# Patient Record
Sex: Female | Born: 1950 | Race: White | Hispanic: No | Marital: Married | Smoking: Never smoker
Health system: Southern US, Community
[De-identification: ages and names within clinical notes are randomized; demographics above are authoritative.]

## PROBLEM LIST (undated history)

## (undated) DIAGNOSIS — M109 Gout, unspecified: Secondary | ICD-10-CM

## (undated) DIAGNOSIS — K219 Gastro-esophageal reflux disease without esophagitis: Secondary | ICD-10-CM

## (undated) DIAGNOSIS — F32A Depression, unspecified: Secondary | ICD-10-CM

## (undated) DIAGNOSIS — J45909 Unspecified asthma, uncomplicated: Secondary | ICD-10-CM

## (undated) DIAGNOSIS — E119 Type 2 diabetes mellitus without complications: Secondary | ICD-10-CM

## (undated) DIAGNOSIS — I10 Essential (primary) hypertension: Secondary | ICD-10-CM

## (undated) HISTORY — PX: JOINT REPLACEMENT: SHX530

---

## 2006-10-28 DIAGNOSIS — R1031 Right lower quadrant pain: Secondary | ICD-10-CM | POA: Insufficient documentation

## 2006-11-10 DIAGNOSIS — E785 Hyperlipidemia, unspecified: Secondary | ICD-10-CM | POA: Insufficient documentation

## 2006-11-10 DIAGNOSIS — F32A Depression, unspecified: Secondary | ICD-10-CM | POA: Insufficient documentation

## 2006-11-10 DIAGNOSIS — I10 Essential (primary) hypertension: Secondary | ICD-10-CM | POA: Insufficient documentation

## 2006-11-10 DIAGNOSIS — G8929 Other chronic pain: Secondary | ICD-10-CM | POA: Insufficient documentation

## 2007-01-11 DIAGNOSIS — Z0000 Encounter for general adult medical examination without abnormal findings: Secondary | ICD-10-CM | POA: Insufficient documentation

## 2007-09-14 DIAGNOSIS — M7060 Trochanteric bursitis, unspecified hip: Secondary | ICD-10-CM | POA: Insufficient documentation

## 2007-09-14 DIAGNOSIS — N390 Urinary tract infection, site not specified: Secondary | ICD-10-CM | POA: Insufficient documentation

## 2007-09-14 DIAGNOSIS — M7550 Bursitis of unspecified shoulder: Secondary | ICD-10-CM | POA: Insufficient documentation

## 2007-11-24 DIAGNOSIS — I890 Lymphedema, not elsewhere classified: Secondary | ICD-10-CM | POA: Insufficient documentation

## 2007-12-08 DIAGNOSIS — E559 Vitamin D deficiency, unspecified: Secondary | ICD-10-CM | POA: Insufficient documentation

## 2007-12-08 DIAGNOSIS — E876 Hypokalemia: Secondary | ICD-10-CM | POA: Insufficient documentation

## 2009-08-30 ENCOUNTER — Ambulatory Visit: Payer: Self-pay | Admitting: Internal Medicine

## 2011-01-24 DIAGNOSIS — Z860100 Personal history of colon polyps, unspecified: Secondary | ICD-10-CM | POA: Insufficient documentation

## 2012-05-17 ENCOUNTER — Emergency Department: Payer: Self-pay | Admitting: Emergency Medicine

## 2012-05-18 LAB — URINALYSIS, COMPLETE
Bilirubin,UR: NEGATIVE
Glucose,UR: NEGATIVE mg/dL (ref 0–75)
Nitrite: NEGATIVE
Ph: 6 (ref 4.5–8.0)
Specific Gravity: 1.009 (ref 1.003–1.030)
Squamous Epithelial: 4
WBC UR: 1 /HPF (ref 0–5)

## 2012-05-18 LAB — BASIC METABOLIC PANEL
Anion Gap: 8 (ref 7–16)
BUN: 16 mg/dL (ref 7–18)
Chloride: 101 mmol/L (ref 98–107)
EGFR (African American): >60
Glucose: 120 mg/dL — ABNORMAL HIGH (ref 65–99)
Osmolality: 276 (ref 275–301)
Potassium: 3.1 mmol/L — ABNORMAL LOW (ref 3.5–5.1)
Sodium: 137 mmol/L (ref 136–145)

## 2012-05-18 LAB — TROPONIN I: Troponin-I: <0.02 ng/mL

## 2012-05-18 LAB — CBC
HCT: 45.9 % (ref 35.0–47.0)
HGB: 15.5 g/dL (ref 12.0–16.0)
MCH: 30.4 pg (ref 26.0–34.0)
MCV: 90 fL (ref 80–100)
RBC: 5.1 10*6/uL (ref 3.80–5.20)
WBC: 8.4 10*3/uL (ref 3.6–11.0)

## 2013-02-23 ENCOUNTER — Emergency Department: Payer: Self-pay | Admitting: Emergency Medicine

## 2013-05-18 ENCOUNTER — Ambulatory Visit: Payer: Self-pay | Admitting: Family Medicine

## 2014-03-17 DIAGNOSIS — G4700 Insomnia, unspecified: Secondary | ICD-10-CM | POA: Insufficient documentation

## 2014-03-17 DIAGNOSIS — M1991 Primary osteoarthritis, unspecified site: Secondary | ICD-10-CM | POA: Insufficient documentation

## 2014-05-31 ENCOUNTER — Ambulatory Visit: Payer: Self-pay | Admitting: Family Medicine

## 2014-07-26 DIAGNOSIS — M1A09X0 Idiopathic chronic gout, multiple sites, without tophus (tophi): Secondary | ICD-10-CM | POA: Insufficient documentation

## 2015-10-11 DIAGNOSIS — I447 Left bundle-branch block, unspecified: Secondary | ICD-10-CM | POA: Insufficient documentation

## 2016-04-01 DIAGNOSIS — F411 Generalized anxiety disorder: Secondary | ICD-10-CM | POA: Insufficient documentation

## 2017-11-20 DIAGNOSIS — N838 Other noninflammatory disorders of ovary, fallopian tube and broad ligament: Secondary | ICD-10-CM | POA: Insufficient documentation

## 2017-11-20 DIAGNOSIS — R7303 Prediabetes: Secondary | ICD-10-CM | POA: Insufficient documentation

## 2018-04-06 DIAGNOSIS — E785 Hyperlipidemia, unspecified: Secondary | ICD-10-CM | POA: Insufficient documentation

## 2018-06-09 DIAGNOSIS — J4520 Mild intermittent asthma, uncomplicated: Secondary | ICD-10-CM | POA: Insufficient documentation

## 2018-12-28 ENCOUNTER — Ambulatory Visit
Admission: EM | Admit: 2018-12-28 | Discharge: 2018-12-28 | Disposition: A | Discharge status: Home / Self Care | Payer: Medicare PPO

## 2018-12-28 ENCOUNTER — Other Ambulatory Visit: Payer: Self-pay

## 2018-12-28 ENCOUNTER — Encounter: Payer: Self-pay | Admitting: Emergency Medicine

## 2018-12-28 DIAGNOSIS — M109 Gout, unspecified: Secondary | ICD-10-CM | POA: Diagnosis not present

## 2018-12-28 HISTORY — DX: Gout, unspecified: M10.9

## 2018-12-28 MED ORDER — PREDNISONE 10 MG PO TABS: ORAL_TABLET | ORAL | 0 refills | Status: DC

## 2018-12-28 NOTE — Discharge Instructions (Addendum)
Take medication as prescribed.Elevate.   Follow up with your primary care physician this week as needed. Return to Urgent care for new or worsening concerns.

## 2018-12-28 NOTE — ED Provider Notes (Signed)
[facility] ____________________________________________  Time seen: Approximately 10:45 AM  I have reviewed the triage vital signs and the nursing notes.   HISTORY  Chief Complaint Gout   HPI Joy Tyler is a 68 y.o. female present for evaluation of right toe and right foot pain present since yesterday.  Denies any fall or injury.  States current pain is consistent with her normal gout flareup.  Last flareup was last month.  Denies drainage, skin changes.  Denies paresthesias, decreased range of motion.  States hurts to press, walk and for bedding to touch.  Reports otherwise doing well.  Denies fevers, cough, chest pain or shortness of breath.  Reports otherwise doing well.  Patient states that she believes this is due to eating sausage this weekend.  Denies diabetes.   Past Medical History:  Diagnosis Date  . Gout     There are no active problems to display for this patient.   Past Surgical History:  Procedure Laterality Date  . JOINT REPLACEMENT       No current facility-administered medications for this encounter.   Current Outpatient Medications:  .  albuterol (VENTOLIN HFA) 108 (90 Base) MCG/ACT inhaler, Inhale into the lungs., Disp: , Rfl:  .  citalopram (CELEXA) 40 MG tablet, , Disp: , Rfl:  .  furosemide (LASIX) 20 MG tablet, , Disp: , Rfl:  .  lisinopril-hydrochlorothiazide (ZESTORETIC) 20-25 MG tablet, TK 1 T PO D, Disp: , Rfl:  .  Omega-3 1000 MG CAPS, Take by mouth., Disp: , Rfl:  .  potassium chloride (KLOR-CON) 8 MEQ tablet, , Disp: , Rfl:  .  predniSONE (DELTASONE) 10 MG tablet, Start 60 mg po day one, then 50 mg po day two, taper by 10 mg daily until complete., Disp: 21 tablet, Rfl: 0  Allergies Patient has no allergy information on record.  History reviewed. No pertinent family history.  Social History Social History   Tobacco Use  . Smoking status: Never Smoker  . Smokeless tobacco: Never Used  Substance Use Topics  .  Alcohol use: Not Currently  . Drug use: Not Currently    Review of Systems Constitutional: No fever Cardiovascular: Denies chest pain. Respiratory: Denies shortness of breath. Gastrointestinal: No abdominal pain.  Musculoskeletal: Positive right foot pain. Skin: Negative for rash.   ____________________________________________   PHYSICAL EXAM:  VITAL SIGNS: ED Triage Vitals  Enc Vitals Group     BP 12/28/18 1024 (!) 140/92     Pulse Rate 12/28/18 1024 68     Resp 12/28/18 1024 18     Temp 12/28/18 1024 97.9 F (36.6 C)     Temp src --      SpO2 12/28/18 1024 97 %     Weight --      Height --      Head Circumference --      Peak Flow --      Pain Score 12/28/18 1020 5     Pain Loc --      Pain Edu? --      Excl. in [place]? --     Constitutional: Alert and oriented. Well appearing and in no acute distress. Eyes: Conjunctivae are normal.       Head: Normocephalic and atraumatic. Cardiovascular: Normal rate, regular rhythm. Grossly normal heart sounds.  Good peripheral circulation. Respiratory: Normal respiratory effort without tachypnea nor retractions. Breath sounds are clear and equal bilaterally. No wheezes, rales, rhonchi. Musculoskeletal: Steady gait.  Bilateral pedal pulses equal and easily palpated.  Except: Right foot first MTP and second MTP tenderness to direct palpation, minimal local edema, no erythema, skin intact, normal distal sensation capillary refill. Neurologic:  Normal speech and language. Speech is normal. No gait instability.  Skin:  Skin is warm, dry and intact. No rash noted. Psychiatric: Mood and affect are normal. Speech and behavior are normal. Patient exhibits appropriate insight and judgment   ___________________________________________   LABS (all labs ordered are listed, but only abnormal results are displayed)  Labs Reviewed - No data to display  PROCEDURES Procedures   INITIAL IMPRESSION / ASSESSMENT AND PLAN / ED COURSE   Pertinent labs & imaging results that were available during my care of the patient were reviewed by me and considered in my medical decision making (see chart for details).  Well-appearing patient.  No acute distress.  Nontraumatic right foot pain consistent with her previous gout.  Will treat with prednisone.  Elevate, supportive care.  Points of triggers.Discussed indication, risks and benefits of medications with patient.  Discussed follow up with Primary care physician this week. Discussed follow up and return parameters including no resolution or any worsening concerns. Patient verbalized understanding and agreed to plan.   ____________________________________________   FINAL CLINICAL IMPRESSION(S) / ED DIAGNOSES  Final diagnoses:  Acute gout of right foot, unspecified cause     ED Discharge Orders         Ordered    predniSONE (DELTASONE) 10 MG tablet     12/28/18 1045           Note: This dictation was prepared with Dragon dictation along with smaller phrase technology. Any transcriptional errors that result from this process are unintentional.         Renford Dills, NP 12/28/18 1106

## 2018-12-28 NOTE — ED Triage Notes (Signed)
Patient in office today due to questionable Gout on right foot. Sx since yesterday .  [id number]

## 2020-04-10 ENCOUNTER — Other Ambulatory Visit: Payer: Self-pay

## 2020-04-10 ENCOUNTER — Ambulatory Visit
Admission: EM | Admit: 2020-04-10 | Discharge: 2020-04-10 | Disposition: A | Discharge status: Home / Self Care | Payer: Medicare HMO | Attending: Emergency Medicine | Admitting: Emergency Medicine

## 2020-04-10 DIAGNOSIS — M109 Gout, unspecified: Secondary | ICD-10-CM | POA: Insufficient documentation

## 2020-04-10 HISTORY — DX: Essential (primary) hypertension: I10

## 2020-04-10 LAB — URIC ACID: Uric Acid, Serum: 7.8 mg/dL — ABNORMAL HIGH (ref 2.5–7.1)

## 2020-04-10 MED ORDER — PREDNISONE 10 MG (21) PO TBPK: ORAL_TABLET | Freq: Every day | ORAL | 0 refills | Status: DC

## 2020-04-10 MED ORDER — HYDROCODONE-ACETAMINOPHEN 5-325 MG PO TABS: 2.0000 | ORAL_TABLET | ORAL | 0 refills | Status: DC | PRN

## 2020-04-10 NOTE — ED Triage Notes (Signed)
Pt with 3 days of gout flare to left ankle and foot. Larey Seat to the ground getting in the car today but denies injury. Has been out of gout medicine for awhile.

## 2020-04-10 NOTE — Discharge Instructions (Addendum)
Take the prednisone according to the package insert.  Start this tomorrow morning with breakfast and then take it every morning thereafter after breakfast.  Use the Norco as needed for severe pain.  This might make you drowsy so do not drive or operate machinery if you take it.  Increase your oral water intake to help flush the uric acid from your system and help decrease your gout flare.  Keep your left foot elevated as much as possible to help decrease inflammation and help with pain.  Discussed the possibility of going on some gout prevention medication with your primary care doctor.

## 2020-04-10 NOTE — ED Provider Notes (Signed)
[facility]    CSN: [id number] Arrival date & time: 04/10/20  1147      History   Chief Complaint Chief Complaint  Patient presents with  . gout flare    HPI Joy Tyler is a 70 y.o. female.   HPI   Patient is a 70 year old female here for evaluation of pain and swelling in her left foot and ankle.  Patient reports that her symptoms started 3 days ago and are consistent with her gout symptoms.  Patient reports that she typically has gout in her left foot but occasionally has been on the right as well.  Patient's last gout episode was September 2020 where she was treated with prednisone.  Patient states that she is not on any kind of gout prevention medications.  Patient does take a baby aspirin daily.  Patient reports that this gout flare was is instigated by her eating shrimp.  Patient reports that trip consumption is a prime trigger for her gout flares.  Patient denies any trauma or injury.  Past Medical History:  Diagnosis Date  . Gout   . Hypertension     There are no problems to display for this patient.   Past Surgical History:  Procedure Laterality Date  . JOINT REPLACEMENT      OB History   No obstetric history on file.      Home Medications    Prior to Admission medications   Medication Sig Start Date End Date Taking? Authorizing Provider  HYDROcodone-acetaminophen (NORCO/VICODIN) 5-325 MG tablet Take 2 tablets by mouth every 4 (four) hours as needed. 04/10/20  Yes Becky Augusta, NP  lisinopril-hydrochlorothiazide (ZESTORETIC) 20-25 MG tablet Take 1 tablet by mouth daily. 07/01/17 11/14/20 Yes [provider]  potassium chloride (KLOR-CON) 8 MEQ tablet  07/01/17  Yes [provider]  predniSONE (STERAPRED UNI-PAK 21 TAB) 10 MG (21) TBPK tablet Take by mouth daily. Take 6 tabs by mouth daily  for 2 days, then 5 tabs for 2 days, then 4 tabs for 2 days, then 3 tabs for 2 days, 2 tabs for 2 days, then 1 tab by mouth daily for 2  days 04/10/20  Yes Becky Augusta, NP  albuterol (VENTOLIN HFA) 108 (90 Base) MCG/ACT inhaler Inhale into the lungs. 04/14/12   [provider]  citalopram (CELEXA) 40 MG tablet  10/28/18   [provider]  furosemide (LASIX) 20 MG tablet  12/13/18   [provider]  lisinopril-hydrochlorothiazide (ZESTORETIC) 20-25 MG tablet TK 1 T PO D 07/19/18   [provider]  potassium chloride (KLOR-CON) 8 MEQ tablet  12/12/18   [provider]    Family History History reviewed. No pertinent family history.  Social History Social History   Tobacco Use  . Smoking status: Never Smoker  . Smokeless tobacco: Never Used  Vaping Use  . Vaping Use: Never used  Substance Use Topics  . Alcohol use: Not Currently  . Drug use: Not Currently     Allergies   Patient has no known allergies.   Review of Systems Review of Systems  Constitutional: Negative for fever.  Musculoskeletal: Positive for arthralgias and joint swelling.  Skin: Positive for color change.  Neurological: Negative for weakness and numbness.     Physical Exam Triage Vital Signs ED Triage Vitals  Enc Vitals Group     BP 04/10/20 1420 100/80     Pulse Rate 04/10/20 1420 63     Resp 04/10/20 1420 20  Temp 04/10/20 1420 98.1 F (36.7 C)     Temp Source 04/10/20 1420 Oral     SpO2 04/10/20 1420 100 %     Weight 04/10/20 1422 230 lb (104.3 kg)     Height --      Head Circumference --      Peak Flow --      Pain Score 04/10/20 1422 10     Pain Loc --      Pain Edu? --      Excl. in [place]? --    No data found.  Updated Vital Signs BP 100/80 (BP Location: Right Arm)   Pulse 63   Temp 98.1 F (36.7 C) (Oral)   Resp 20   Wt 230 lb (104.3 kg)   SpO2 100%   Visual Acuity Right Eye Distance:   Left Eye Distance:   Bilateral Distance:    Right Eye Near:   Left Eye Near:    Bilateral Near:     Physical Exam Vitals and nursing note reviewed.  Constitutional:      General:  She is in acute distress.     Appearance: Normal appearance. She is not toxic-appearing.     Comments: Patient appears to be in pain.  HENT:     Head: Normocephalic and atraumatic.  Cardiovascular:     Rate and Rhythm: Normal rate and regular rhythm.     Pulses: Normal pulses.     Heart sounds: Normal heart sounds. No murmur heard. No gallop.   Pulmonary:     Effort: Pulmonary effort is normal.     Breath sounds: Normal breath sounds. No wheezing, rhonchi or rales.  Musculoskeletal:        General: Swelling and tenderness present. No deformity or signs of injury.  Skin:    General: Skin is warm.     Capillary Refill: Capillary refill takes less than 2 seconds.     Findings: Erythema present.  Neurological:     General: No focal deficit present.     Mental Status: She is alert and oriented to person, place, and time.  Psychiatric:        Mood and Affect: Mood normal.        Behavior: Behavior normal.        Thought Content: Thought content normal.        Judgment: Judgment normal.      UC Treatments / Results  Labs (all labs ordered are listed, but only abnormal results are displayed) Labs Reviewed  URIC ACID    EKG   Radiology No results found.  Procedures Procedures (including critical care time)  Medications Ordered in UC Medications - No data to display  Initial Impression / Assessment and Plan / UC Course  I have reviewed the triage vital signs and the nursing notes.  Pertinent labs & imaging results that were available during my care of the patient were reviewed by me and considered in my medical decision making (see chart for details).   For evaluation of possible gout flare that started 3 days ago.  Patient presents with warmth, tenderness, and swelling to the dorsum of the left foot and lateral aspect of her left ankle.  DP and PT pulses are 2+.  Patient has full range of motion.  No alterations of sensation.  Patient's presentation is consistent with  gout flare.  Will check uric acid level and discharged home on prednisone.  Patient was last treated December 28, 2018 with prednisone taper that  successfully resolved her flare.  Patient also advised to discuss the possibility of starting something like allopurinol or Uloric to prevent further gout flares in the future, especially since she is on a low-dose aspirin which could further potentiate gout flares.   Final Clinical Impressions(s) / UC Diagnoses   Final diagnoses:  Acute gout, unspecified cause, unspecified site     Discharge Instructions     Take the prednisone according to the package insert.  Start this tomorrow morning with breakfast and then take it every morning thereafter after breakfast.  Use the Norco as needed for severe pain.  This might make you drowsy so do not drive or operate machinery if you take it.  Increase your oral water intake to help flush the uric acid from your system and help decrease your gout flare.  Keep your left foot elevated as much as possible to help decrease inflammation and help with pain.  Discussed the possibility of going on some gout prevention medication with your primary care doctor.    ED Prescriptions    Medication Sig Dispense Auth. Provider   predniSONE (STERAPRED UNI-PAK 21 TAB) 10 MG (21) TBPK tablet Take by mouth daily. Take 6 tabs by mouth daily  for 2 days, then 5 tabs for 2 days, then 4 tabs for 2 days, then 3 tabs for 2 days, 2 tabs for 2 days, then 1 tab by mouth daily for 2 days 42 tablet Becky Augusta, NP   HYDROcodone-acetaminophen (NORCO/VICODIN) 5-325 MG tablet Take 2 tablets by mouth every 4 (four) hours as needed. 6 tablet Becky Augusta, NP     I have reviewed the PDMP during this encounter.   Becky Augusta, NP 04/10/20 1520

## 2020-06-14 ENCOUNTER — Other Ambulatory Visit: Payer: Self-pay | Admitting: Specialist

## 2020-06-14 ENCOUNTER — Ambulatory Visit
Admission: RE | Admit: 2020-06-14 | Discharge: 2020-06-14 | Disposition: A | Discharge status: Home / Self Care | Payer: Medicare HMO | Attending: Specialist | Admitting: Specialist

## 2020-06-14 ENCOUNTER — Ambulatory Visit
Admission: RE | Admit: 2020-06-14 | Discharge: 2020-06-14 | Disposition: A | Discharge status: Home / Self Care | Payer: Medicare HMO | Source: Ambulatory Visit | Attending: Specialist | Admitting: Specialist

## 2020-06-14 ENCOUNTER — Other Ambulatory Visit: Payer: Self-pay

## 2020-06-14 DIAGNOSIS — M109 Gout, unspecified: Secondary | ICD-10-CM | POA: Insufficient documentation

## 2021-03-05 DIAGNOSIS — K219 Gastro-esophageal reflux disease without esophagitis: Secondary | ICD-10-CM | POA: Insufficient documentation

## 2021-10-10 DIAGNOSIS — R7309 Other abnormal glucose: Secondary | ICD-10-CM | POA: Insufficient documentation

## 2022-01-11 ENCOUNTER — Ambulatory Visit
Admission: EM | Admit: 2022-01-11 | Discharge: 2022-01-11 | Disposition: A | Discharge status: Home / Self Care | Payer: Medicare HMO | Attending: Physician Assistant | Admitting: Physician Assistant

## 2022-01-11 ENCOUNTER — Encounter: Payer: Self-pay | Admitting: Emergency Medicine

## 2022-01-11 DIAGNOSIS — N3001 Acute cystitis with hematuria: Secondary | ICD-10-CM | POA: Insufficient documentation

## 2022-01-11 DIAGNOSIS — R300 Dysuria: Secondary | ICD-10-CM | POA: Diagnosis not present

## 2022-01-11 DIAGNOSIS — R350 Frequency of micturition: Secondary | ICD-10-CM | POA: Diagnosis not present

## 2022-01-11 LAB — URINALYSIS, ROUTINE W REFLEX MICROSCOPIC
Glucose, UA: NEGATIVE mg/dL
Nitrite: POSITIVE — AB
Protein, ur: >300 mg/dL — AB
Specific Gravity, Urine: 1.025 (ref 1.005–1.030)
pH: 6.5 (ref 5.0–8.0)

## 2022-01-11 LAB — URINALYSIS, MICROSCOPIC (REFLEX)
RBC / HPF: >50 RBC/hpf (ref 0–5)
WBC, UA: >50 WBC/hpf (ref 0–5)

## 2022-01-11 MED ORDER — CEPHALEXIN 500 MG PO CAPS: 500.0000 mg | ORAL_CAPSULE | Freq: Two times a day (BID) | ORAL | 0 refills | Status: AC

## 2022-01-11 NOTE — Discharge Instructions (Addendum)

## 2022-01-11 NOTE — ED Provider Notes (Signed)
[facility]    CSN: [id number] Arrival date & time: 01/11/22  0955      History   Chief Complaint Chief Complaint  Patient presents with   Dysuria    HPI Joy Tyler is a 71 y.o. female presenting for dysuria, urinary frequency and urgency x2 days.  Reports having chills when she tried to urinate.  Denies any fever.  No blood in urine.  Reports some lower abdominal/bladder pressure.  History of back pain and he does not feel any worse than normal.  Denies vaginal discharge or itching.  Concern for UTI.  No history of recurrent UTIs or any recent UTIs.  No other complaints.  HPI  Past Medical History:  Diagnosis Date   Gout    Hypertension     There are no problems to display for this patient.   Past Surgical History:  Procedure Laterality Date   JOINT REPLACEMENT      OB History   No obstetric history on file.      Home Medications    Prior to Admission medications   Medication Sig Start Date End Date Taking? Authorizing Provider  cephALEXin (KEFLEX) 500 MG capsule Take 1 capsule (500 mg total) by mouth 2 (two) times daily for 7 days. 01/11/22 01/18/22 Yes Danton Clap, PA-C  citalopram (CELEXA) 40 MG tablet  10/28/18  Yes [provider]  lisinopril-hydrochlorothiazide (ZESTORETIC) 20-25 MG tablet TK 1 T PO D 07/19/18  Yes [provider]  albuterol (VENTOLIN HFA) 108 (90 Base) MCG/ACT inhaler Inhale into the lungs. 04/14/12   [provider]  furosemide (LASIX) 20 MG tablet  12/13/18   [provider]  HYDROcodone-acetaminophen (NORCO/VICODIN) 5-325 MG tablet Take 2 tablets by mouth every 4 (four) hours as needed. 04/10/20   Margarette Canada, NP  lisinopril-hydrochlorothiazide (ZESTORETIC) 20-25 MG tablet Take 1 tablet by mouth daily. 07/01/17 11/14/20  [provider]  potassium chloride (KLOR-CON) 8 MEQ tablet  12/12/18   [provider]  potassium chloride (KLOR-CON) 8 MEQ tablet  07/01/17    [provider]  predniSONE (STERAPRED UNI-PAK 21 TAB) 10 MG (21) TBPK tablet Take by mouth daily. Take 6 tabs by mouth daily  for 2 days, then 5 tabs for 2 days, then 4 tabs for 2 days, then 3 tabs for 2 days, 2 tabs for 2 days, then 1 tab by mouth daily for 2 days 04/10/20   Margarette Canada, NP    Family History History reviewed. No pertinent family history.  Social History Social History   Tobacco Use   Smoking status: Never   Smokeless tobacco: Never  Vaping Use   Vaping Use: Never used  Substance Use Topics   Alcohol use: Not Currently   Drug use: Not Currently     Allergies   Patient has no known allergies.   Review of Systems Review of Systems  Constitutional:  Negative for chills and fever.  Gastrointestinal:  Positive for abdominal pain. Negative for diarrhea, nausea and vomiting.  Genitourinary:  Positive for dysuria, frequency and urgency. Negative for decreased urine volume, flank pain, hematuria, pelvic pain, vaginal bleeding, vaginal discharge and vaginal pain.  Musculoskeletal:  Negative for back pain.  Skin:  Negative for rash.     Physical Exam Triage Vital Signs ED Triage Vitals  Enc Vitals Group     BP 01/11/22 1016 (!) 165/89     Pulse Rate 01/11/22 1016 66     Resp 01/11/22 1016 14  Temp 01/11/22 1016 98.3 F (36.8 C)     Temp Source 01/11/22 1016 Oral     SpO2 01/11/22 1016 97 %     Weight 01/11/22 1013 232 lb (105.2 kg)     Height 01/11/22 1013 5\' 3"  (1.6 m)     Head Circumference --      Peak Flow --      Pain Score 01/11/22 1013 5     Pain Loc --      Pain Edu? --      Excl. in [place]? --    No data found.  Updated Vital Signs BP (!) 165/89 (BP Location: Right Arm)   Pulse 66   Temp 98.3 F (36.8 C) (Oral)   Resp 14   Ht 5\' 3"  (1.6 m)   Wt 232 lb (105.2 kg)   SpO2 97%   BMI 41.10 kg/m      Physical Exam Vitals and nursing note reviewed.  Constitutional:      General: She is not in acute distress.    Appearance:  Normal appearance. She is not ill-appearing or toxic-appearing.  HENT:     Head: Normocephalic and atraumatic.  Eyes:     General: No scleral icterus.       Right eye: No discharge.        Left eye: No discharge.     Conjunctiva/sclera: Conjunctivae normal.  Cardiovascular:     Rate and Rhythm: Normal rate and regular rhythm.     Heart sounds: Normal heart sounds.  Pulmonary:     Effort: Pulmonary effort is normal. No respiratory distress.     Breath sounds: Normal breath sounds.  Abdominal:     Palpations: Abdomen is soft.     Tenderness: There is abdominal tenderness (suprapubic). There is no right CVA tenderness or left CVA tenderness.  Musculoskeletal:     Cervical back: Neck supple.  Skin:    General: Skin is dry.  Neurological:     General: No focal deficit present.     Mental Status: She is alert. Mental status is at baseline.     Motor: No weakness.     Gait: Gait normal.  Psychiatric:        Mood and Affect: Mood normal.        Behavior: Behavior normal.        Thought Content: Thought content normal.      UC Treatments / Results  Labs (all labs ordered are listed, but only abnormal results are displayed) Labs Reviewed  URINALYSIS, ROUTINE W REFLEX MICROSCOPIC - Abnormal; Notable for the following components:      Result Value   Color, Urine AMBER (*)    APPearance CLOUDY (*)    Hgb urine dipstick LARGE (*)    Bilirubin Urine SMALL (*)    Ketones, ur TRACE (*)    Protein, ur >300 (*)    Nitrite POSITIVE (*)    Leukocytes,Ua SMALL (*)    All other components within normal limits  URINALYSIS, MICROSCOPIC (REFLEX) - Abnormal; Notable for the following components:   Bacteria, UA MANY (*)    All other components within normal limits  URINE CULTURE    EKG   Radiology No results found.  Procedures Procedures (including critical care time)  Medications Ordered in UC Medications - No data to display  Initial Impression / Assessment and Plan / UC  Course  I have reviewed the triage vital signs and the nursing notes.  Pertinent labs & imaging  results that were available during my care of the patient were reviewed by me and considered in my medical decision making (see chart for details).   71 year old female presenting for dysuria, frequency and urgency x2 days.  No fever or flank pain.  She is afebrile and overall well-appearing.  On exam she has some mild suprapubic tenderness to palpation.  No CVA tenderness.  Chest clear to auscultation heart regular rate rhythm.  Urinalysis shows amber-colored cloudy urine with large hemoglobin, small bili, protein, positive nitrites and small leukocytes with many bacteria seen on microscopic urinalysis.  We will send urine for culture.  We will treat patient for urinary tract infection given these results.  Sent Keflex to pharmacy.  Advised patient we will change the antibiotic based on the culture result if necessary.  Encouraged her to increase her fluid intake.  Advised her to return or go to emergency department if she has any fever, flank pain or acute worsening of symptoms.   Final Clinical Impressions(s) / UC Diagnoses   Final diagnoses:  Acute cystitis with hematuria  Urinary frequency  Dysuria     Discharge Instructions      UTI: Based on either symptoms or urinalysis, you may have a urinary tract infection. We will send the urine for culture and call with results in a few days. Begin antibiotics at this time. Your symptoms should be much improved over the next 2-3 days. Increase rest and fluid intake. If for some reason symptoms are worsening or not improving after a couple of days or the urine culture determines the antibiotics you are taking will not treat the infection, the antibiotics may be changed. Return or go to ER for fever, back pain, worsening urinary pain, discharge, increased blood in urine. May take Tylenol or Motrin OTC for pain relief or consider AZO if no  contraindications      ED Prescriptions     Medication Sig Dispense Auth. Provider   cephALEXin (KEFLEX) 500 MG capsule Take 1 capsule (500 mg total) by mouth 2 (two) times daily for 7 days. 14 capsule Danton Clap, PA-C      PDMP not reviewed this encounter.   Danton Clap, PA-C 01/11/22 1055

## 2022-01-11 NOTE — ED Triage Notes (Signed)
Patient c/o burning when urinating and urinary frequency that started on Thursday.  Patient denies fevers.

## 2022-01-13 LAB — URINE CULTURE: Culture: >=100000 — AB

## 2022-05-10 IMAGING — CR DG FOOT 2V*L*
3 series · 3 of 3 positions shown · non-contrast
Comparison: Left foot x-rays dated August 30, 2009.

CLINICAL DATA: Left foot pain around the second and third MTP
joints for the past few months.

EXAM:
LEFT FOOT - 2 VIEW

[foot ap]
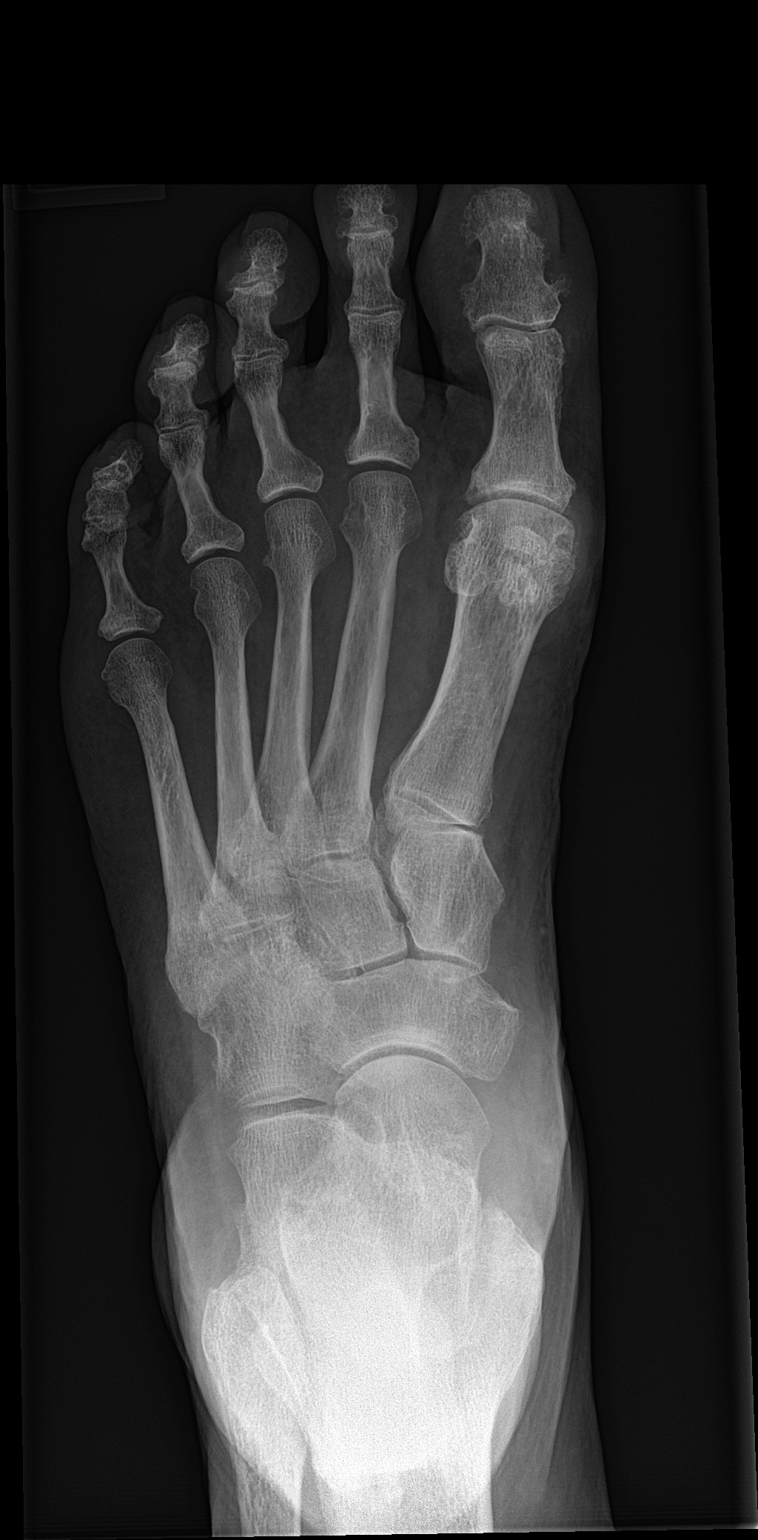

[foot lat (1 of 2)]
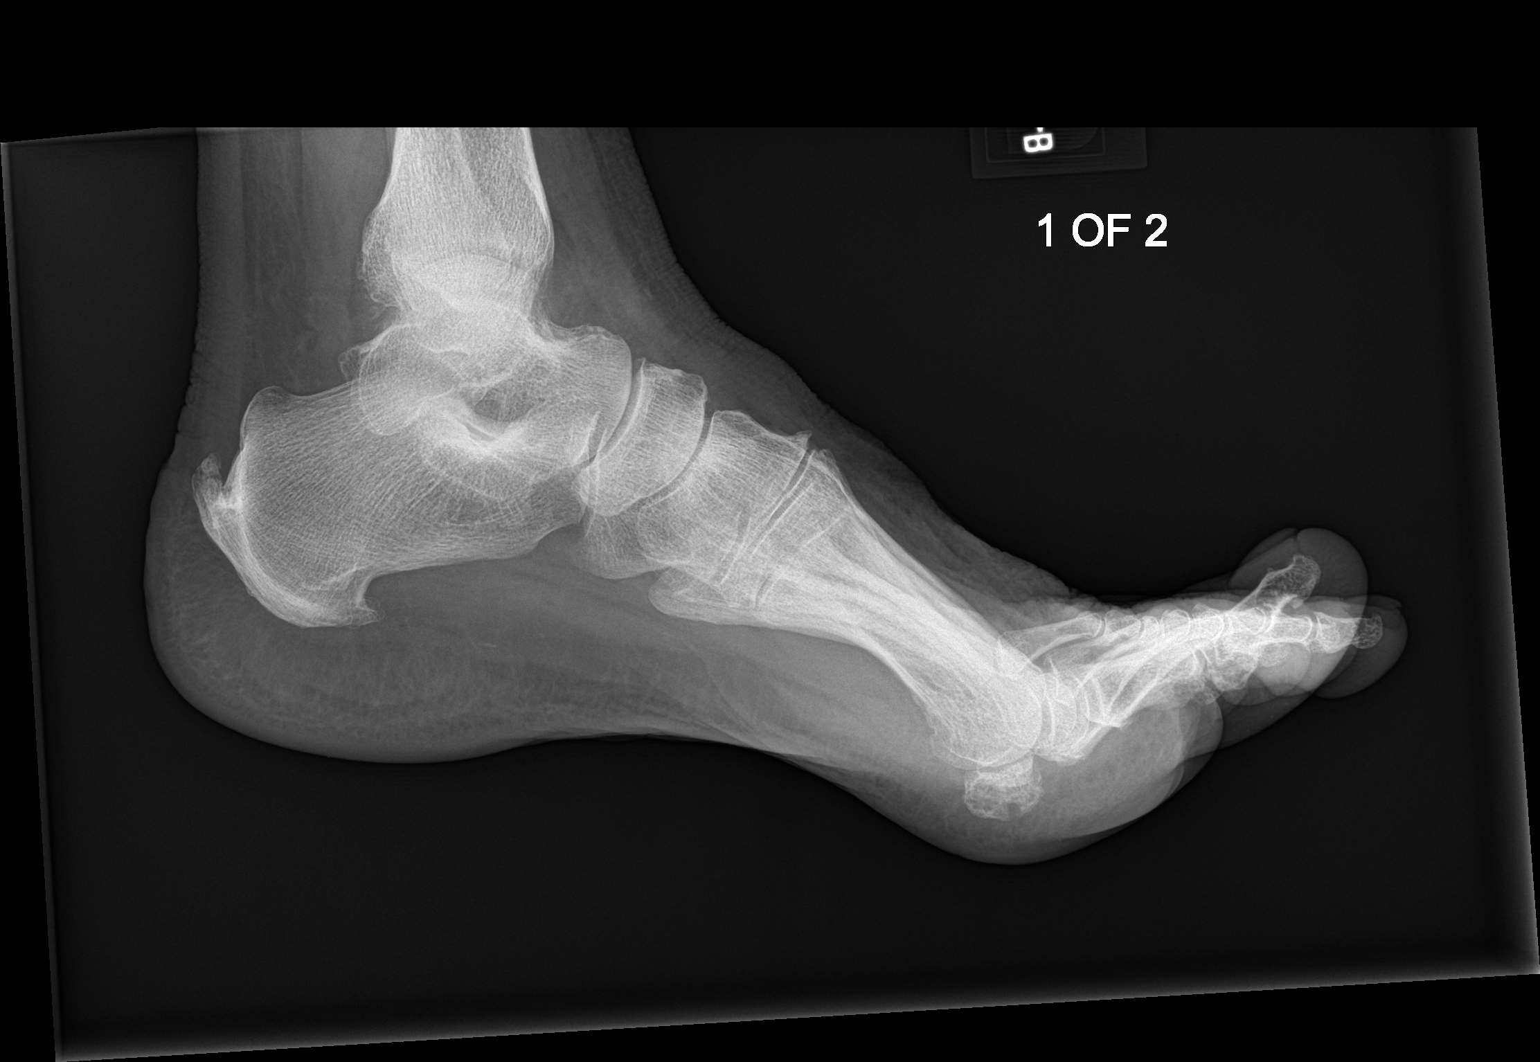

[foot lat (2 of 2)]
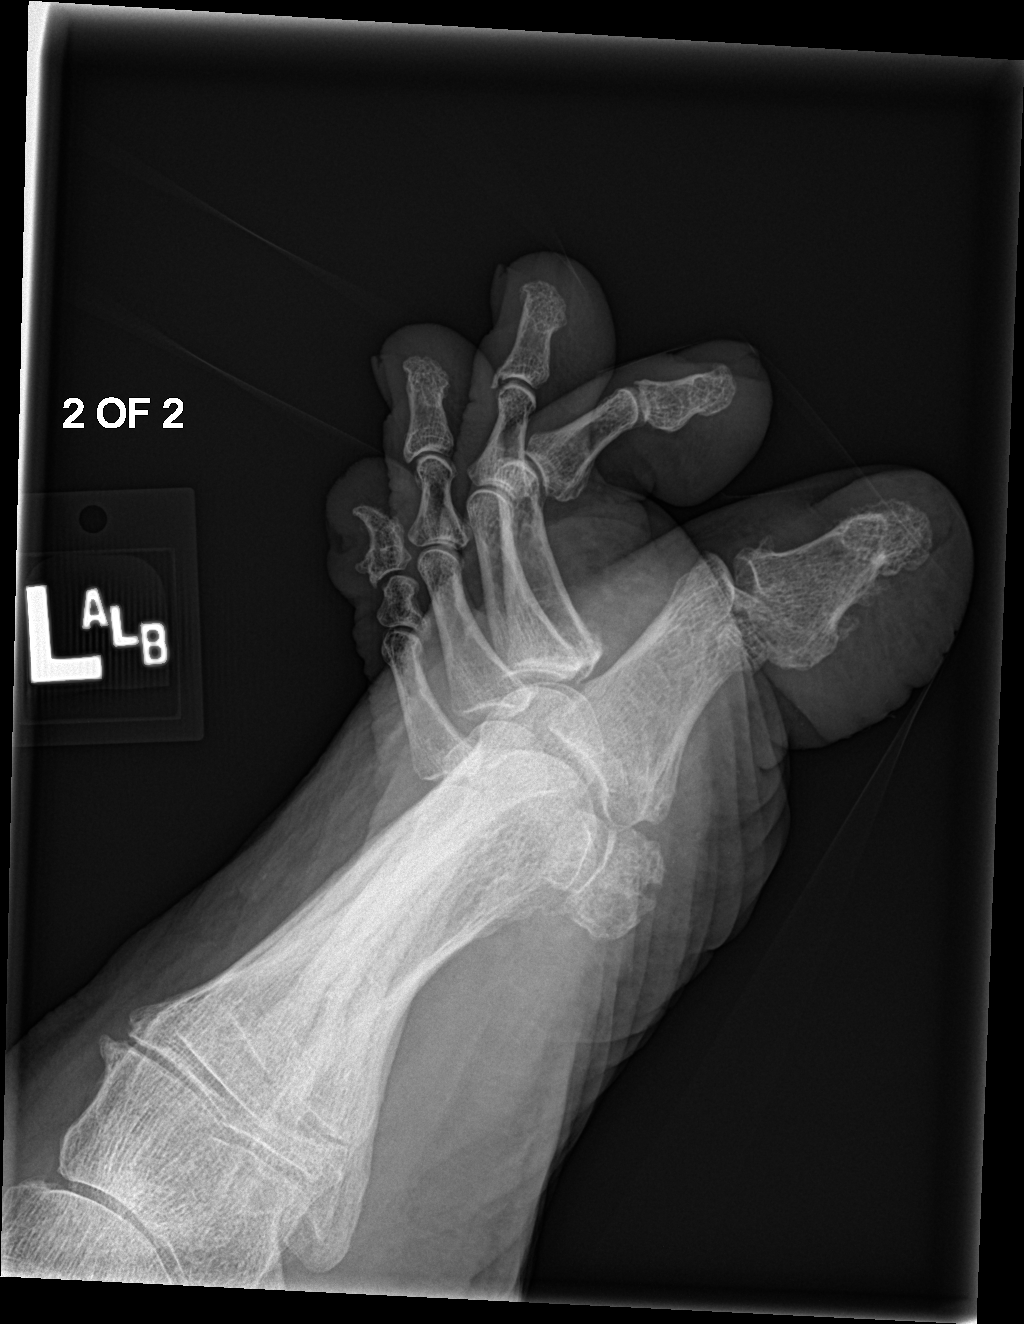

[3 of 3 positions shown; findings below may reference images not displayed]

FINDINGS: No acute fracture or dislocation. New juxta-articular erosion along
the medial aspect of the first metatarsal head. Progressive mild
first MTP joint space narrowing with small marginal osteophytes. New
mild dorsal spurring of the second TMT joint. Unchanged large
Achilles and small plantar enthesophytes. Soft tissues are
unremarkable.
IMPRESSION: 1. New juxta-articular erosion along the medial aspect of the first
metatarsal head, suspicious for gout.
2. Progressive mild first MTP and second TMT joint osteoarthritis.

## 2022-10-14 DIAGNOSIS — N3281 Overactive bladder: Secondary | ICD-10-CM | POA: Insufficient documentation

## 2023-09-30 ENCOUNTER — Ambulatory Visit: Admission: EM | Admit: 2023-09-30 | Discharge: 2023-09-30 | Disposition: A | Discharge status: Home / Self Care

## 2023-09-30 DIAGNOSIS — R1900 Intra-abdominal and pelvic swelling, mass and lump, unspecified site: Secondary | ICD-10-CM | POA: Insufficient documentation

## 2023-09-30 DIAGNOSIS — M109 Gout, unspecified: Secondary | ICD-10-CM | POA: Diagnosis not present

## 2023-09-30 MED ORDER — DEXAMETHASONE SODIUM PHOSPHATE 10 MG/ML IJ SOLN
10.0000 mg | Freq: Once | INTRAMUSCULAR | Status: AC
  Administered 2023-09-30: 10 mg via INTRAMUSCULAR

## 2023-09-30 MED ORDER — PREDNISONE 10 MG (21) PO TBPK: ORAL_TABLET | ORAL | 0 refills | Status: DC

## 2023-09-30 NOTE — ED Triage Notes (Signed)
 Pt c/o L foot pain x3 days. Hx of gout. Has tried tylenol  w/o relief.

## 2023-09-30 NOTE — Discharge Instructions (Addendum)
 Starting tomorrow morning begin the prednisone  taper.  Take it according to the package instructions over the next 6 days.  Increase your oral fluid intake to help flush the uric acid from your system.  Keep your left leg elevated is much as possible to help decrease swelling and aid in pain relief.  Be mindful that the prednisone  will cause your sugar to go up.  If your sugar goes up higher than 350 contact your primary care provider.

## 2023-09-30 NOTE — ED Provider Notes (Signed)
 [facility]    CSN: [id number] Arrival date & time: 09/30/23  1615      History   Chief Complaint Chief Complaint  Patient presents with   Foot Pain    HPI Joy Tyler is a 73 y.o. female.   HPI  73 year old female with past medical history significant for gout, hypertension, overactive bladder, GERD, and generalized anxiety disorder presents for evaluation of pain in her left ankle that started 3 days ago.  She has a history of gout and reports this feels like a gout flare.  She believes eating barbecue triggered it.  She denies injury.  Past Medical History:  Diagnosis Date   Gout    Hypertension     Patient Active Problem List   Diagnosis Date Noted   Pelvic mass 09/30/2023   OAB (overactive bladder) 10/14/2022   Dysglycemia 10/10/2021   GERD (gastroesophageal reflux disease) 03/05/2021   Mild intermittent asthma without complication 06/09/2018   Dyslipidemia 04/06/2018   Ovarian mass, left 11/20/2017   Prediabetes 11/20/2017   GAD (generalized anxiety disorder) 04/01/2016   Left bundle branch block (LBBB) 10/11/2015   Morbid obesity with BMI of 40.0-44.9, adult (HCC) 07/12/2015   Chronic idiopathic gout of multiple sites 07/26/2014   Insomnia 03/17/2014   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      Home Medications    Prior to Admission  medications   Medication Sig Start Date End Date Taking? Authorizing Provider  potassium chloride SA (KLOR-CON M) 20 MEQ tablet Take 20 mEq by mouth daily. 08/10/23 08/09/24 Yes [provider]  predniSONE  (STERAPRED UNI-PAK 21 TAB) 10 MG (21) TBPK tablet Take 6 tablets on day 1, 5 tablets day 2, 4 tablets day 3, 3 tablets day 4, 2 tablets day 5, 1 tablet day 6 09/30/23  Yes Bernardino Ditch, NP  albuterol (VENTOLIN HFA) 108 (90 Base) MCG/ACT inhaler Inhale into the lungs. 04/14/12   [provider]  amLODipine (NORVASC) 2.5 MG tablet Take 2.5 mg by mouth daily.    [provider]  citalopram (CELEXA) 40 MG tablet  10/28/18   [provider]  furosemide (LASIX) 20 MG tablet  12/13/18   [provider]  HYDROcodone -acetaminophen  (NORCO/VICODIN) 5-325 MG tablet Take 2 tablets by mouth every 4 (four) hours as needed. 04/10/20   Bernardino Ditch, NP  lisinopril-hydrochlorothiazide (ZESTORETIC) 20-25 MG tablet TK 1 T PO D 07/19/18   [provider]  lisinopril-hydrochlorothiazide (ZESTORETIC) 20-25 MG tablet Take 1 tablet by mouth daily. 07/01/17 11/14/20  [provider]  metFORMIN (GLUCOPHAGE) 500 MG tablet Take 500 mg by mouth 2 (two) times daily.    [provider]  omeprazole (PRILOSEC) 20 MG capsule Take 20 mg by mouth daily.    [provider]  potassium chloride (KLOR-CON) 8 MEQ tablet  12/12/18   [provider]  potassium chloride (KLOR-CON) 8 MEQ tablet  07/01/17   [provider]  rosuvastatin (CRESTOR) 40 MG tablet Take 40 mg by mouth daily.    [provider]    Family History History reviewed. No pertinent family history.  Social History Social History   Tobacco Use   Smoking status: Never   Smokeless tobacco: Never  Vaping Use   Vaping status: Never Used  Substance Use Topics   Alcohol use: Not Currently   Drug use: Not Currently     Allergies   Patient has no known  allergies.   Review of Systems Review of Systems  Musculoskeletal:  Positive for arthralgias and joint swelling.  Skin:  Negative for color change.     Physical Exam Triage Vital Signs ED Triage Vitals  Encounter Vitals Group     BP 09/30/23 1654 93/63     Girls Systolic BP Percentile --      Girls Diastolic BP Percentile --      Boys Systolic BP Percentile --      Boys Diastolic BP Percentile --      Pulse Rate 09/30/23 1654 66     Resp 09/30/23 1654 16     Temp 09/30/23 1654 99.6 F (37.6 C)     Temp Source 09/30/23 1654 Oral     SpO2 09/30/23 1654 96 %     Weight 09/30/23 1654 240 lb (108.9 kg)     Height --      Head Circumference --      Peak Flow --      Pain Score 09/30/23 1657 10     Pain Loc --      Pain Education --      Exclude from Growth Chart --    No data found.  Updated Vital Signs BP 93/63 (BP Location: Right Arm)   Pulse 66   Temp 99.6 F (37.6 C) (Oral)   Resp 16   Wt 240 lb (108.9 kg)   SpO2 96%   BMI 42.51 kg/m   Visual Acuity Right Eye Distance:   Left Eye Distance:   Bilateral Distance:    Right Eye Near:   Left Eye Near:    Bilateral Near:     Physical Exam Vitals and nursing note reviewed.  Constitutional:      Appearance: Normal appearance. She is not ill-appearing.  HENT:     Head: Normocephalic and atraumatic.  Musculoskeletal:        General: Swelling and tenderness present. No deformity or signs of injury.  Skin:    General: Skin is warm and dry.     Capillary Refill: Capillary refill takes less than 2 seconds.     Findings: Erythema present.  Neurological:     General: No focal deficit present.     Mental Status: She is alert and oriented to person, place, and time.      UC Treatments / Results  Labs (all labs ordered are listed, but only abnormal results are displayed) Labs Reviewed - No data to display  EKG   Radiology No results found.  Procedures Procedures (including critical care  time)  Medications Ordered in UC Medications - No data to display  Initial Impression / Assessment and Plan / UC Course  I have reviewed the triage vital signs and the nursing notes.  Pertinent labs & imaging results that were available during my care of the patient were reviewed by me and considered in my medical decision making (see chart for details).   Patient is a  pleasant, nontoxic-appearing 73 year old female presenting for evaluation of possible gout flare in her left ankle that started 3 days ago after eating barbecue.  She reports that barbecue typically triggers for gout.  It has been approximately 3 years since her last gout flare.  She denies any injury to her foot or ankle.  DP and PT pulses are 2+ and distal sensations intact.  Patient does have range of motion of her ankle in full but it does cause pain.  Upon review of records in epic the patient was last treated for a gout flare in March 2022 with a prednisone  taper.  I will have staff administer 10 mg of IM Decadron and discharge her home on a 6-day prednisone  taper.   Final Clinical Impressions(s) / UC Diagnoses   Final diagnoses:  Acute gout of left ankle, unspecified cause     Discharge Instructions      Starting tomorrow morning begin the prednisone  taper.  Take it according to the package instructions over the next 6 days.  Increase your oral fluid intake to help flush the uric acid from your system.  Keep your left leg elevated is much as possible to help decrease swelling and aid in pain relief.  Be mindful that the prednisone  will cause your sugar to go up.  If your sugar goes up higher than 350 contact your primary care provider.     ED Prescriptions     Medication Sig Dispense Auth. Provider   predniSONE  (STERAPRED UNI-PAK 21 TAB) 10 MG (21) TBPK tablet Take 6 tablets on day 1, 5 tablets day 2, 4 tablets day 3, 3 tablets day 4, 2 tablets day 5, 1 tablet day 6 21 tablet Bernardino Ditch, NP      PDMP  not reviewed this encounter.   Bernardino Ditch, NP 09/30/23 1710

## 2024-01-22 ENCOUNTER — Other Ambulatory Visit: Payer: Self-pay | Admitting: Family Medicine

## 2024-01-22 DIAGNOSIS — Z1231 Encounter for screening mammogram for malignant neoplasm of breast: Secondary | ICD-10-CM

## 2024-01-22 DIAGNOSIS — Z780 Asymptomatic menopausal state: Secondary | ICD-10-CM

## 2024-04-20 ENCOUNTER — Other Ambulatory Visit: Payer: Self-pay | Admitting: Orthopedic Surgery

## 2024-05-02 ENCOUNTER — Other Ambulatory Visit: Payer: Self-pay

## 2024-05-02 ENCOUNTER — Inpatient Hospital Stay
Admission: RE | Admit: 2024-05-02 | Discharge: 2024-05-02 | Disposition: A | Source: Ambulatory Visit | Attending: Orthopedic Surgery

## 2024-05-02 VITALS — BP 181/94 | HR 67 | Resp 16 | Ht 65.0 in | Wt 225.0 lb

## 2024-05-02 DIAGNOSIS — Z01818 Encounter for other preprocedural examination: Secondary | ICD-10-CM | POA: Insufficient documentation

## 2024-05-02 DIAGNOSIS — R7303 Prediabetes: Secondary | ICD-10-CM | POA: Insufficient documentation

## 2024-05-02 DIAGNOSIS — I447 Left bundle-branch block, unspecified: Secondary | ICD-10-CM | POA: Insufficient documentation

## 2024-05-02 DIAGNOSIS — I10 Essential (primary) hypertension: Secondary | ICD-10-CM | POA: Insufficient documentation

## 2024-05-02 HISTORY — DX: Type 2 diabetes mellitus without complications: E11.9

## 2024-05-02 HISTORY — DX: Gastro-esophageal reflux disease without esophagitis: K21.9

## 2024-05-02 HISTORY — DX: Unspecified asthma, uncomplicated: J45.909

## 2024-05-02 HISTORY — DX: Depression, unspecified: F32.A

## 2024-05-02 LAB — CBC WITH DIFFERENTIAL/PLATELET
Abs Immature Granulocytes: 0.03 10*3/uL (ref 0.00–0.07)
Basophils Absolute: 0.1 10*3/uL (ref 0.0–0.1)
Basophils Relative: 1 %
Eosinophils Absolute: 0.1 10*3/uL (ref 0.0–0.5)
Eosinophils Relative: 1 %
HCT: 43.1 % (ref 36.0–46.0)
Hemoglobin: 14.9 g/dL (ref 12.0–15.0)
Immature Granulocytes: 1 %
Lymphocytes Relative: 31 %
Lymphs Abs: 1.8 10*3/uL (ref 0.7–4.0)
MCH: 30 pg (ref 26.0–34.0)
MCHC: 34.6 g/dL (ref 30.0–36.0)
MCV: 86.9 fL (ref 80.0–100.0)
Monocytes Absolute: 0.4 10*3/uL (ref 0.1–1.0)
Monocytes Relative: 7 %
Neutro Abs: 3.5 10*3/uL (ref 1.7–7.7)
Neutrophils Relative %: 59 %
Platelets: 166 10*3/uL (ref 150–400)
RBC: 4.96 MIL/uL (ref 3.87–5.11)
RDW: 13 % (ref 11.5–15.5)
WBC: 6 10*3/uL (ref 4.0–10.5)
nRBC: 0 % (ref 0.0–0.2)

## 2024-05-02 LAB — URINALYSIS, ROUTINE W REFLEX MICROSCOPIC
Bilirubin Urine: NEGATIVE
Glucose, UA: NEGATIVE mg/dL
Hgb urine dipstick: NEGATIVE
Ketones, ur: NEGATIVE mg/dL
Nitrite: NEGATIVE
Protein, ur: NEGATIVE mg/dL
Specific Gravity, Urine: 1.019 (ref 1.005–1.030)
pH: 5 (ref 5.0–8.0)

## 2024-05-02 LAB — COMPREHENSIVE METABOLIC PANEL WITH GFR
ALT: 15 U/L (ref 0–44)
AST: 20 U/L (ref 15–41)
Albumin: 4.4 g/dL (ref 3.5–5.0)
Alkaline Phosphatase: 57 U/L (ref 38–126)
Anion gap: 14 (ref 5–15)
BUN: 17 mg/dL (ref 8–23)
CO2: 25 mmol/L (ref 22–32)
Calcium: 10 mg/dL (ref 8.9–10.3)
Chloride: 98 mmol/L (ref 98–111)
Creatinine, Ser: 0.93 mg/dL (ref 0.44–1.00)
GFR, Estimated: >60 mL/min
Glucose, Bld: 110 mg/dL — ABNORMAL HIGH (ref 70–99)
Potassium: 4 mmol/L (ref 3.5–5.1)
Sodium: 136 mmol/L (ref 135–145)
Total Bilirubin: 0.8 mg/dL (ref 0.0–1.2)
Total Protein: 7.6 g/dL (ref 6.5–8.1)

## 2024-05-02 LAB — SURGICAL PCR SCREEN
MRSA, PCR: NEGATIVE
Staphylococcus aureus: NEGATIVE

## 2024-05-09 ENCOUNTER — Encounter: Payer: Self-pay | Admitting: Urgent Care

## 2024-05-09 ENCOUNTER — Ambulatory Visit: Admission: RE | Admit: 2024-05-09 | Admitting: Orthopedic Surgery

## 2024-05-09 ENCOUNTER — Encounter: Admission: RE | Payer: Self-pay | Source: Home / Self Care

## 2024-05-09 SURGERY — ARTHROPLASTY, KNEE, TOTAL
Anesthesia: Choice | Site: Knee | Laterality: Left
